# Patient Record
Sex: Male | Born: 1995
Health system: Southern US, Community
[De-identification: ages and names within clinical notes are randomized; demographics above are authoritative.]

## PROBLEM LIST (undated history)

## (undated) HISTORY — PX: NASAL SEPTUM SURGERY: SHX37

---
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
## 2019-06-28 ENCOUNTER — Other Ambulatory Visit: Payer: Self-pay | Admitting: Pediatric Gastroenterology

## 2019-06-28 DIAGNOSIS — K769 Liver disease, unspecified: Secondary | ICD-10-CM

## 2019-06-28 DIAGNOSIS — Z148 Genetic carrier of other disease: Secondary | ICD-10-CM

## 2019-07-03 ENCOUNTER — Ambulatory Visit (HOSPITAL_COMMUNITY)
Admission: RE | Admit: 2019-07-03 | Discharge: 2019-07-03 | Disposition: A | Discharge status: Home / Self Care | Payer: 59 | Source: Ambulatory Visit | Attending: Pediatric Gastroenterology | Admitting: Pediatric Gastroenterology

## 2019-07-03 ENCOUNTER — Other Ambulatory Visit: Payer: Self-pay

## 2019-07-03 DIAGNOSIS — R799 Abnormal finding of blood chemistry, unspecified: Secondary | ICD-10-CM | POA: Insufficient documentation

## 2019-07-03 DIAGNOSIS — Z148 Genetic carrier of other disease: Secondary | ICD-10-CM

## 2019-07-03 DIAGNOSIS — K769 Liver disease, unspecified: Secondary | ICD-10-CM | POA: Insufficient documentation

## 2021-04-17 IMAGING — US US ABDOMEN LIMITED
1 series · 14 of 25 positions shown · non-contrast
Comparison: None.

CLINICAL DATA: Chronic liver disease

EXAM:
ULTRASOUND ABDOMEN LIMITED RIGHT UPPER QUADRANT

[Series 1: us abdomen limited · 14 of 50 slices shown]
[im 1/50]
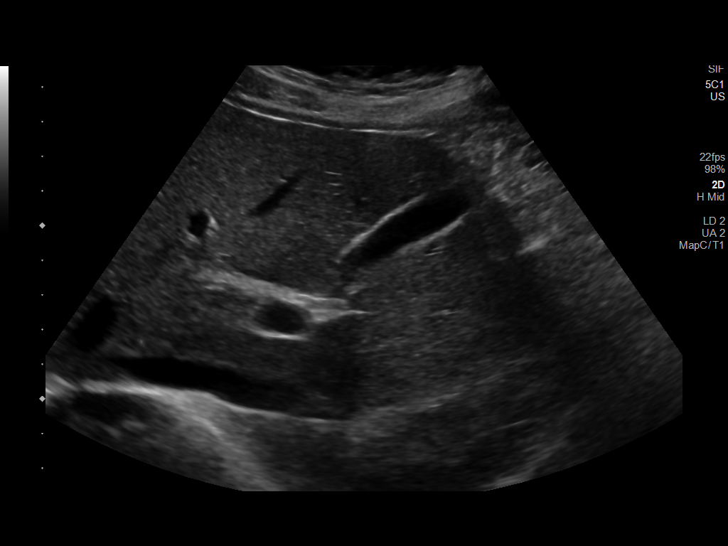
[im 5/50]
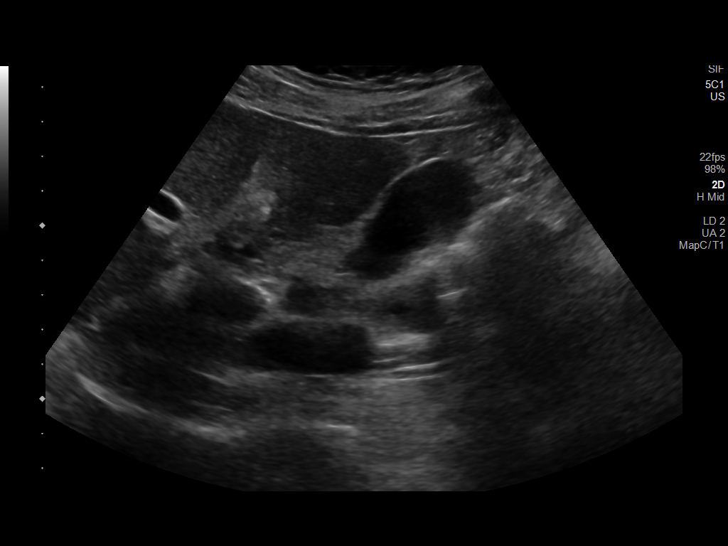
[im 9/50]
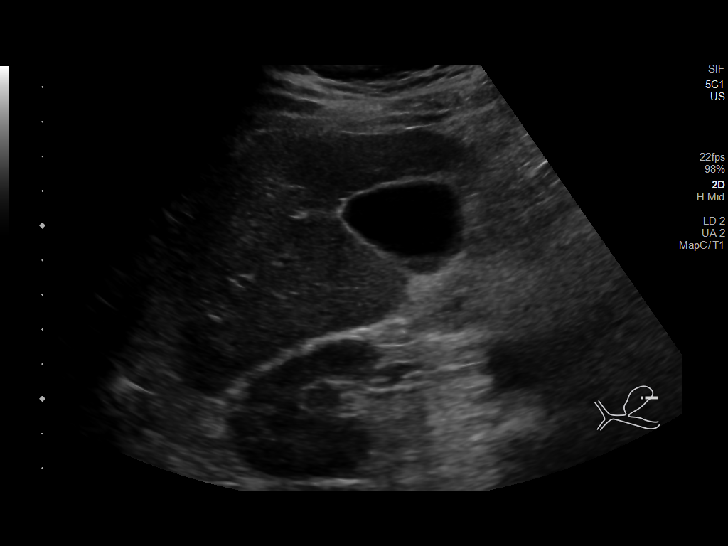
[im 13/50]
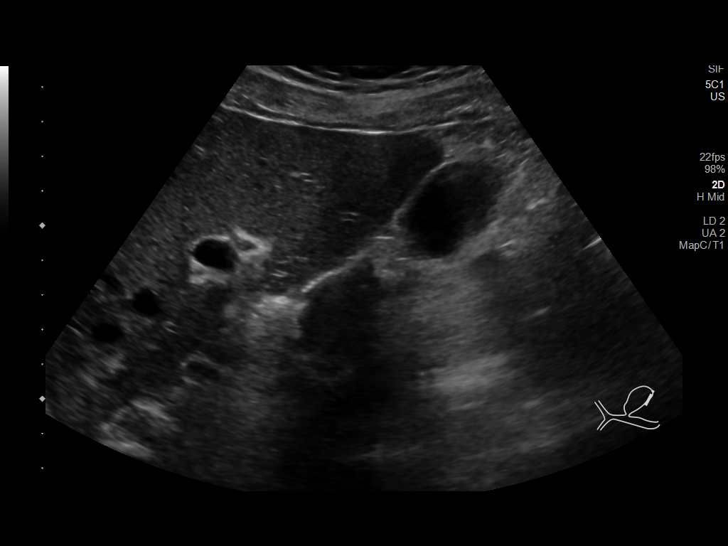
[im 17/50]
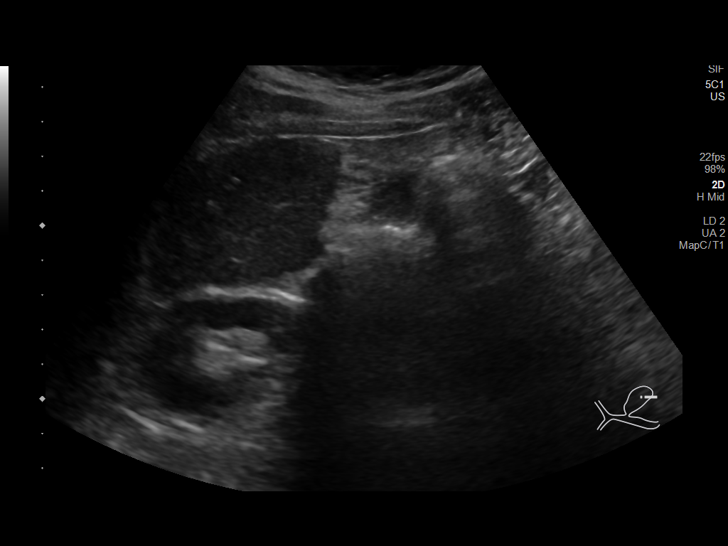
[im 19/50]
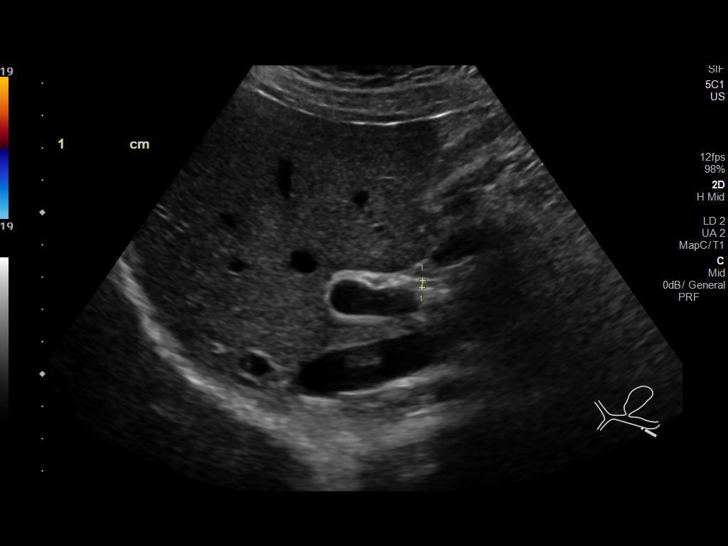
[im 23/50]
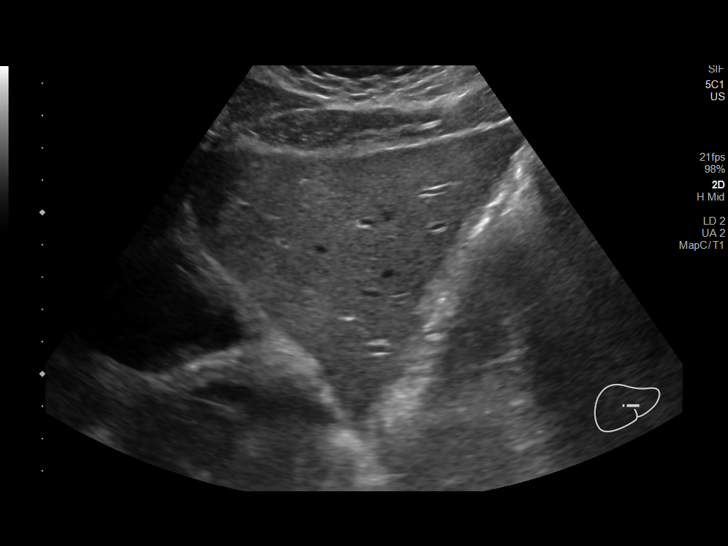
[im 27/50]
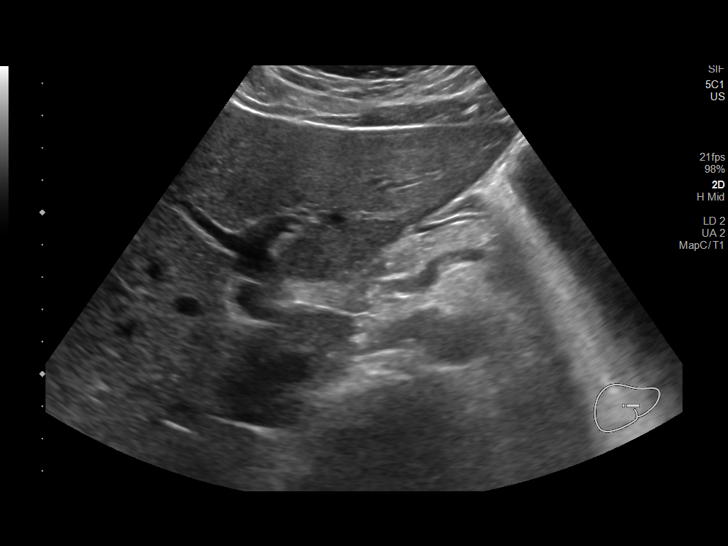
[im 31/50]
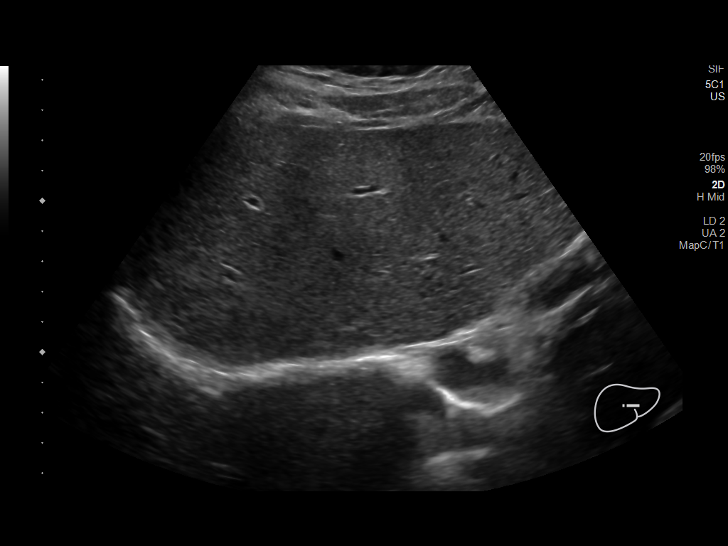
[im 33/50]
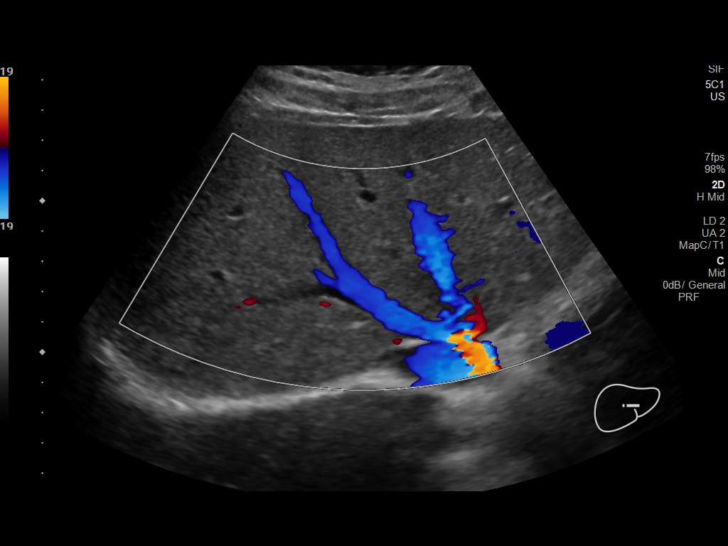
[im 37/50]
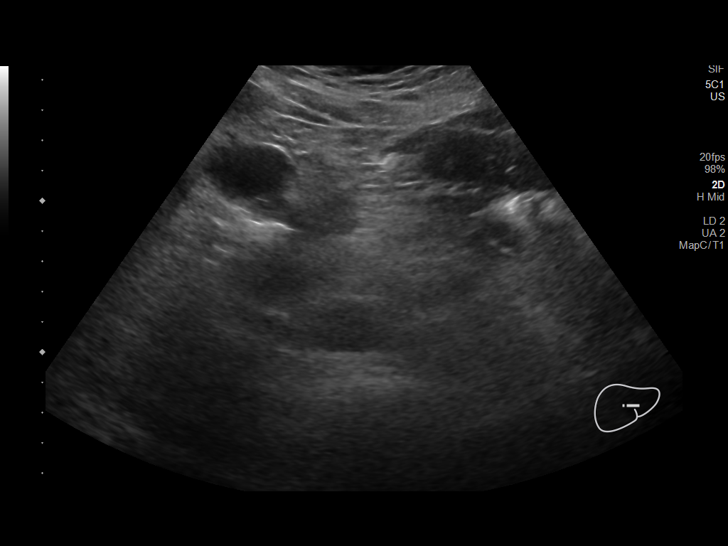
[im 41/50]
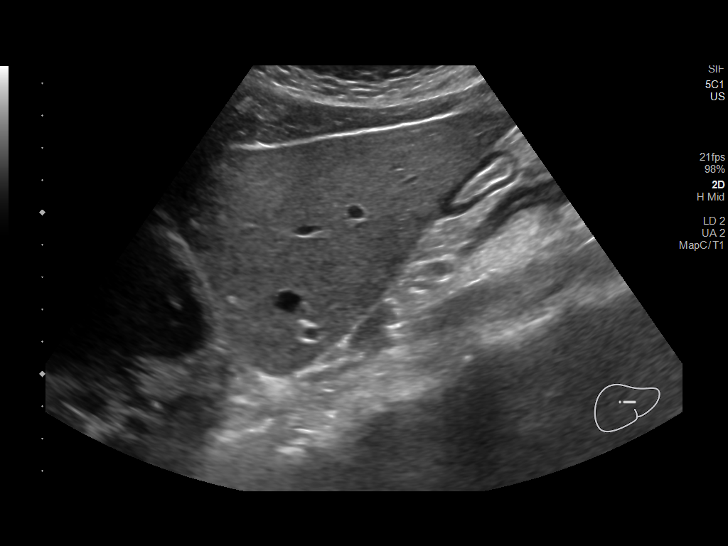
[im 45/50]
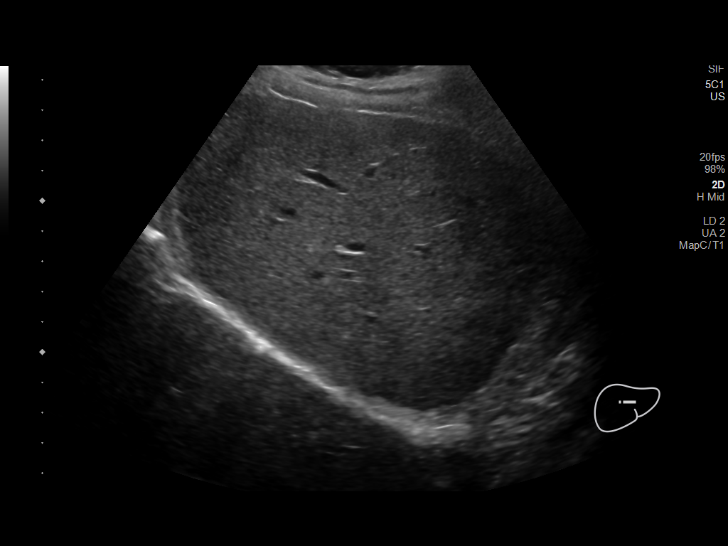
[im 50/50]
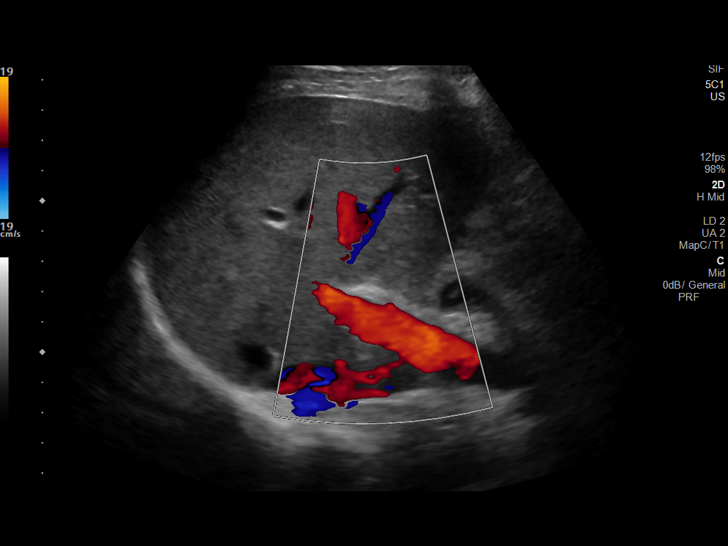

[14 of 25 positions shown; findings below may reference images not displayed]

FINDINGS: Gallbladder:

No gallstones or wall thickening visualized. No sonographic Murphy
sign noted by sonographer.

Common bile duct:

Diameter: 2 mm

Liver:

No focal lesion identified. Within normal limits in parenchymal
echogenicity. Portal vein is patent on color Doppler imaging with
normal direction of blood flow towards the liver.

Other: None.
IMPRESSION: No sonographic abnormality identified in the liver.

## 2021-07-18 ENCOUNTER — Ambulatory Visit: Payer: 59 | Admitting: Registered Nurse

## 2022-02-09 ENCOUNTER — Emergency Department (INDEPENDENT_AMBULATORY_CARE_PROVIDER_SITE_OTHER)
Admission: EM | Admit: 2022-02-09 | Discharge: 2022-02-09 | Disposition: A | Discharge status: Home / Self Care | Payer: 59 | Source: Home / Self Care | Attending: Family Medicine | Admitting: Family Medicine

## 2022-02-09 ENCOUNTER — Ambulatory Visit: Payer: Self-pay

## 2022-02-09 ENCOUNTER — Encounter: Payer: Self-pay | Admitting: Emergency Medicine

## 2022-02-09 DIAGNOSIS — L247 Irritant contact dermatitis due to plants, except food: Secondary | ICD-10-CM

## 2022-02-09 MED ORDER — METHYLPREDNISOLONE ACETATE 80 MG/ML IJ SUSP
80.0000 mg | Freq: Once | INTRAMUSCULAR | Status: AC
  Administered 2022-02-09: 80 mg via INTRAMUSCULAR

## 2022-02-09 NOTE — Discharge Instructions (Signed)
Continue anti histamines for the itching ?May use claritin or zyrtec during the day ?Benadryl at nite ?The shot will last a few days ?Use cool compresses to the eye area ?Call for problems ? ?

## 2022-02-09 NOTE — ED Provider Notes (Signed)
?[facility] ? ? ? ?CSN: [id number] ?Arrival date & time: 02/09/22  1452 ? ? ?  ? ?History   ?Chief Complaint ?Chief Complaint  ?Patient presents with  ? Rash  ? ? ?HPI ?David Richardson is a 26 y.o. male.  ? ?HPI ? ?Patient states he did some yard work and mowing a few days ago.  He then noticed rash on his arms and legs.  He has been using antihistamines and cortisone cream.  He was doing okay, then today noticed that he had rash on both upper eyelids and on the side of his cheeks.  This is causing some swelling and increased discomfort. ? ?History reviewed. No pertinent past medical history. ? ?There are no problems to display for this patient. ? ? ?Past Surgical History:  ?Procedure Laterality Date  ? NASAL SEPTUM SURGERY    ? ? ? ? ? ?Home Medications   ? ?Prior to Admission medications   ?Medication Sig Start Date End Date Taking? Authorizing Provider  ?EPINEPHrine (EPIPEN JR) 0.15 MG/0.3ML injection Inject into the muscle.    [provider]  ? ? ?Family History ?History reviewed. No pertinent family history. ? ?Social History ?Social History  ? ?Tobacco Use  ? Smoking status: Never  ? Smokeless tobacco: Never  ?Vaping Use  ? Vaping Use: Never used  ?Substance Use Topics  ? Alcohol use: Not Currently  ? Drug use: Not Currently  ? ? ? ?Allergies   ?Eggs or egg-derived products and Amoxicillin ? ? ?Review of Systems ?Review of Systems ? ?See HPI ?Physical Exam ?Triage Vital Signs ?ED Triage Vitals  ?Enc Vitals Group  ?   BP 02/09/22 1502 114/70  ?   Pulse Rate 02/09/22 1502 67  ?   Resp 02/09/22 1502 16  ?   Temp 02/09/22 1502 98.1 ?F (36.7 ?C)  ?   Temp Source 02/09/22 1502 Oral  ?   SpO2 02/09/22 1502 99 %  ?   Weight --   ?   Height --   ?   Head Circumference --   ?   Peak Flow --   ?   Pain Score 02/09/22 1503 0  ?   Pain Loc --   ?   Pain Edu? --   ?   Excl. in GC? --   ? ?No data found. ? ?Updated Vital Signs ?BP 114/70 (BP Location: Right Arm)   Pulse 67   Temp 98.1 ?F (36.7 ?C)  (Oral)   Resp 16   SpO2 99%  ? ?   ? ?Physical Exam ?Constitutional:   ?   General: He is not in acute distress. ?   Appearance: He is well-developed.  ?HENT:  ?   Head: Normocephalic and atraumatic.  ? ?   Comments: Patient is fine erythematous vesicular rash on area as indicated ?Eyes:  ?   Conjunctiva/sclera: Conjunctivae normal.  ?   Pupils: Pupils are equal, round, and reactive to light.  ?Cardiovascular:  ?   Rate and Rhythm: Normal rate.  ?Pulmonary:  ?   Effort: Pulmonary effort is normal. No respiratory distress.  ?Abdominal:  ?   General: There is no distension.  ?   Palpations: Abdomen is soft.  ?Musculoskeletal:     ?   General: Normal range of motion.  ?   Cervical back: Normal range of motion.  ?Skin: ?   General: Skin is warm and dry.  ?   Findings: Rash present.  ?  Comments: Scattered vesicular rash on arms and legs with some linear patterns  ?Neurological:  ?   Mental Status: He is alert.  ?Psychiatric:     ?   Mood and Affect: Mood normal.     ?   Behavior: Behavior normal.  ? ? ? ?UC Treatments / Results  ?Labs ?(all labs ordered are listed, but only abnormal results are displayed) ?Labs Reviewed - No data to display ? ?EKG ? ? ?Radiology ?No results found. ? ?Procedures ?Procedures (including critical care time) ? ?Medications Ordered in UC ?Medications  ?methylPREDNISolone acetate (DEPO-MEDROL) injection 80 mg (80 mg Intramuscular Given 02/09/22 1537)  ? ? ?Initial Impression / Assessment and Plan / UC Course  ?I have reviewed the triage vital signs and the nursing notes. ? ?Pertinent labs & imaging results that were available during my care of the patient were reviewed by me and considered in my medical decision making (see chart for details). ? ?  ? ?Final Clinical Impressions(s) / UC Diagnoses  ? ?Final diagnoses:  ?Contact dermatitis and eczema due to plant  ? ? ? ?Discharge Instructions   ? ?  ?Continue anti histamines for the itching ?May use claritin or zyrtec during the day ?Benadryl  at nite ?The shot will last a few days ?Use cool compresses to the eye area ?Call for problems ? ? ? ?ED Prescriptions   ?None ?  ? ?PDMP not reviewed this encounter. ?  ?Eustace Moore, MD ?02/09/22 1652 ? ?

## 2022-02-09 NOTE — ED Triage Notes (Signed)
Pt states last Wednesday he noticed a rash on arms and legs after mowing. States rash has now spread to face and it itching worse.  ?

## 2024-01-26 ENCOUNTER — Ambulatory Visit (INDEPENDENT_AMBULATORY_CARE_PROVIDER_SITE_OTHER)

## 2024-01-26 ENCOUNTER — Ambulatory Visit: Payer: BC Managed Care – PPO | Admitting: Pulmonary Disease

## 2024-01-26 ENCOUNTER — Encounter: Payer: Self-pay | Admitting: Pulmonary Disease

## 2024-01-26 VITALS — BP 123/83 | HR 68 | Ht 66.0 in | Wt 178.0 lb

## 2024-01-26 DIAGNOSIS — E8801 Alpha-1-antitrypsin deficiency: Secondary | ICD-10-CM

## 2024-01-26 NOTE — Patient Instructions (Signed)
 We will check alpha 1 level today and a chest x-ray  Follow up in 3 months for pulmonary function tests

## 2024-01-26 NOTE — Progress Notes (Signed)
 Synopsis: Referred in March 2025 for Alpha-1 antitrypsin deficiency  Subjective:   PATIENT ID: David Richardson GENDER: male DOB: 1996-10-14, MRN: [id number]   HPI  Chief Complaint  Patient presents with   Consult   David Richardson is a 28 year old male, never smoker who is referred to pulmonary clinic for history of alpha-1 antitrypsin deficiency.   He was previously followed at [facility] but would like to establish local care for monitoring of his condition. The patient has serological confirmation of alpha-1 antitrypsin ZZ genotype with level 22 from 2022. He was followed by Hepatology at [facility] but never made it to his pulmonology evaluation. He had IR liver biopsy 11/2022 which did not show significant fibrosis or steatosis.   He denies any respiratory symptoms. He denies cough, wheezing or shortness of breath. 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 He does not smoke or vape. Denies significant dust or chemical exposures from work.   No past medical history on file.   No family history on file.   Social History   Socioeconomic History   Marital status: Single    Spouse name: Not on file   Number of children: Not on file   Years of education: Not on file   Highest education level: Not on file  Occupational History   Not on file  Tobacco Use   Smoking status: Never   Smokeless tobacco: Never  Vaping Use   Vaping status: Never Used  Substance and Sexual Activity   Alcohol use: Not Currently   Drug use: Not Currently   Sexual activity: Not on file  Other Topics Concern   Not on file  Social History Narrative   Not on file   Social Drivers of Health   Financial Resource Strain: Low Risk  (12/13/2023)   Received from [facility]   Overall Financial Resource Strain (CARDIA)    Difficulty of Paying Living Expenses: Not very hard  Food Insecurity: No Food Insecurity (12/13/2023)   Received from [facility]   Hunger Vital Sign    Worried About  Running Out of Food in the Last Year: Never true    Ran Out of Food in the Last Year: Never true  Transportation Needs: No Transportation Needs (12/13/2023)   Received from [facility] - Transportation    Lack of Transportation (Medical): No    Lack of Transportation (Non-Medical): No  Physical Activity: Sufficiently Active (12/13/2023)   Received from [facility]   Exercise Vital Sign    Days of Exercise per Week: 5 days    Minutes of Exercise per Session: 150+ min  Stress: No Stress Concern Present (12/13/2023)   Received from [facility] - Occupational Stress Questionnaire    Feeling of Stress : Only a little  Social Connections: Socially Integrated (12/13/2023)   Received from [facility]   Social Network    How would you rate your social network (family, work, friends)?: Good participation with social networks  Intimate Partner Violence: Not At Risk (12/13/2023)   Received from [facility]   HITS    Over the last 12 months how often did your partner physically hurt you?: Never    Over the last 12 months how often did your partner insult you or talk down to you?: Never    Over the last 12 months how often did your partner threaten you with physical harm?: Never  Over the last 12 months how often did your partner scream or curse at you?: Never     Allergies  Allergen Reactions   Egg-Derived Products Other (See Comments)    Pt denies   Amoxicillin Rash     Outpatient Medications Prior to Visit  Medication Sig Dispense Refill   EPINEPHrine (EPIPEN JR) 0.15 MG/0.3ML injection Inject into the muscle.     No facility-administered medications prior to visit.    Review of Systems  Constitutional:  Negative for chills, fever, malaise/fatigue and weight loss.  HENT:  Negative for congestion, sinus pain and sore throat.   Eyes: Negative.   Respiratory:  Negative for cough, hemoptysis, sputum production, shortness of  breath and wheezing.   Cardiovascular:  Negative for chest pain, palpitations, orthopnea, claudication and leg swelling.  Gastrointestinal:  Negative for abdominal pain, heartburn, nausea and vomiting.  Genitourinary: Negative.   Musculoskeletal:  Negative for joint pain and myalgias.  Skin:  Negative for rash.  Neurological:  Negative for weakness.  Endo/Heme/Allergies: Negative.   Psychiatric/Behavioral: Negative.      Objective:   Vitals:   01/26/24 1540  BP: 123/83  Pulse: 68  SpO2: 98%  Weight: 178 lb (80.7 kg)  Height: 5\' 6"  (1.676 m)   Physical Exam Constitutional:      General: He is not in acute distress.    Appearance: Normal appearance.  Eyes:     General: No scleral icterus.    Conjunctiva/sclera: Conjunctivae normal.  Cardiovascular:     Rate and Rhythm: Normal rate and regular rhythm.  Pulmonary:     Breath sounds: No wheezing, rhonchi or rales.  Musculoskeletal:     Right lower leg: No edema.     Left lower leg: No edema.  Skin:    General: Skin is warm and dry.  Neurological:     General: No focal deficit present.     CBC No results found for: "WBC", "RBC", "HGB", "HCT", "PLT", "MCV", "MCH", "MCHC", "RDW", "LYMPHSABS", "MONOABS", "EOSABS", "BASOSABS"   Chest imaging:  PFT:     No data to display          Labs:  Path: 11/2022 Liver biopsy A: Liver, non-targeted biopsy - Liver parenchyma with mild bile ductular reaction (see comment) - Periportal hepatocytes with PAS-D positive cytoplasmic globules, compatible with clinical history of alpha-1 antitrypsin deficiency   Diagnosis Comment: The PAS-D positive cytoplasmic globules are compatible with the patient's known history of alpha-1-antitrypsin deficiency. No significant fibrosis or steatosis are identified. The patient's chronically elevated liver enzymes with a cholestatic pattern, mild bile ductular reaction, and copper deposition are suspicious for chronic biliary tract injury, however  the pattern of copper deposition is unusual and the histopathologic findings do not point to a specific etiology. Clinical, serologic, and radiographic correlation is recommended.   Echo:  Heart Catheterization:     Assessment & Plan:   Alpha-1-antitrypsin deficiency (HCC) - Plan: DG Chest 2 View, Pulmonary Function Test, Alpha-1-Antitrypsin Deficiency  Discussion: David Richardson is a 28 year old male, never smoker who is referred to pulmonary clinic for history of alpha-1 antitrypsin deficiency.  Alpha-1 Antitrypsin Deficiency Serological confirmation of alpha-1 antitrypsin ZZ genotype with level 22 in 2022.  - Order chest x-ray. - Order pulmonary function tests. - Check alpha-1 antitrypsin level. - Schedule follow-up in 3 months for pulmonary function test review. - recommend annual CMP for monitoring of LFTs   Intravenous augmentation therapy in those with AATD is recommended for: Individuals with an FEV1  less than or equal to 65% predicted. For those with lung disease related to AATD and an FEV1 greater than 65%, we recommend discussion with each individual regarding the potential benefits of reducing lung function decline with consideration of the cost of therapy and lack of evidence for such benefit.   Sandhaus RA, Turino G, Brantly ML, et al. The diagnosis and management of alpha-1 antitrypsin deficiency in the adult. Chronic Obstr Pulm Dis. 2016; 3(3): 161-096. doi: [email or web address]://[email or web address]  Melody Comas, MD [facility] Office: [phone]   See Amion for personal pager PCCM on call pager [phone] until 7pm. Please call Elink 7p-7a. [phone]   Current Outpatient Medications:    EPINEPHrine (EPIPEN JR) 0.15 MG/0.3ML injection, Inject into the muscle., Disp: , Rfl:

## 2024-01-30 ENCOUNTER — Encounter: Payer: Self-pay | Admitting: Pulmonary Disease

## 2024-02-08 ENCOUNTER — Ambulatory Visit: Payer: Self-pay | Admitting: Allergy

## 2024-06-15 ENCOUNTER — Ambulatory Visit: Payer: Self-pay | Admitting: Allergy

## 2024-08-14 NOTE — Progress Notes (Unsigned)
 New Patient Note  RE: ODIES DESA MRN: [id number] DOB: February 20, 1996 Date of Office Visit: 08/15/2024  Consult requested by: No ref. provider found Primary care provider: Pcp, No  Chief Complaint: No chief complaint on file.  History of Present Illness: I had the pleasure of seeing Roel Douthat for initial evaluation at the [facility] on 08/15/2024. He is a 28 y.o. male, who is referred here by No ref. provider found for the evaluation of ***.  Discussed the use of AI scribe software for clinical note transcription with the patient, who gave verbal consent to proceed.  History of Present Illness             ***  Assessment and Plan: Nghia is a 28 y.o. male with: ***  Assessment and Plan               No follow-ups on file.  No orders of the defined types were placed in this encounter.  Lab Orders  No laboratory test(s) ordered today    Other allergy screening: Asthma: {Blank single:19197::yes,no} Rhino conjunctivitis: {Blank single:19197::yes,no} Food allergy: {Blank single:19197::yes,no} Medication allergy: {Blank single:19197::yes,no} Hymenoptera allergy: {Blank single:19197::yes,no} Urticaria: {Blank single:19197::yes,no} Eczema:{Blank single:19197::yes,no} History of recurrent infections suggestive of immunodeficency: {Blank single:19197::yes,no}  Diagnostics: Spirometry:  Tracings reviewed. His effort: {Blank single:19197::Good reproducible efforts.,It was hard to get consistent efforts and there is a question as to whether this reflects a maximal maneuver.,Poor effort, data can not be interpreted.} FVC: ***L FEV1: ***L, ***% predicted FEV1/FVC ratio: ***% Interpretation: {Blank single:19197::Spirometry consistent with mild obstructive disease,Spirometry consistent with moderate obstructive disease,Spirometry consistent with severe obstructive disease,Spirometry consistent with  possible restrictive disease,Spirometry consistent with mixed obstructive and restrictive disease,Spirometry uninterpretable due to technique,Spirometry consistent with normal pattern,No overt abnormalities noted given today's efforts}.  Please see scanned spirometry results for details.  Skin Testing: {Blank single:19197::Select foods,Environmental allergy panel,Environmental allergy panel and select foods,Food allergy panel,None,Deferred due to recent antihistamines use}. *** Results discussed with patient/family.   Past Medical History: There are no active problems to display for this patient.  No past medical history on file. Past Surgical History: Past Surgical History:  Procedure Laterality Date  . NASAL SEPTUM SURGERY     Medication List:  Current Outpatient Medications  Medication Sig Dispense Refill  . EPINEPHrine (EPIPEN JR) 0.15 MG/0.3ML injection Inject into the muscle.     No current facility-administered medications for this visit.   Allergies: Allergies  Allergen Reactions  . Egg Protein-Containing Drug Products Other (See Comments)    Pt denies  . Amoxicillin Rash   Social History: Social History   Socioeconomic History  . Marital status: Single    Spouse name: Not on file  . Number of children: Not on file  . Years of education: Not on file  . Highest education level: Not on file  Occupational History  . Not on file  Tobacco Use  . Smoking status: Never  . Smokeless tobacco: Never  Vaping Use  . Vaping status: Never Used  Substance and Sexual Activity  . Alcohol use: Not Currently  . Drug use: Not Currently  . Sexual activity: Not on file  Other Topics Concern  . Not on file  Social History Narrative  . Not on file   Social Drivers of Health   Financial Resource Strain: Low Risk  (12/13/2023)   Received from [facility]   Overall Financial Resource Strain (CARDIA)   . Difficulty of Paying Living Expenses: Not very  hard  Food Insecurity: No Food Insecurity (12/13/2023)   Received from [facility]   Hunger Vital Sign   . Within the past 12 months, you worried that your food would run out before you got the money to buy more.: Never true   . Within the past 12 months, the food you bought just didn't last and you didn't have money to get more.: Never true  Transportation Needs: No Transportation Needs (12/13/2023)   Received from [facility] - Transportation   . Lack of Transportation (Medical): No   . Lack of Transportation (Non-Medical): No  Physical Activity: Sufficiently Active (12/13/2023)   Received from [facility]   Exercise Vital Sign   . On average, how many days per week do you engage in moderate to strenuous exercise (like a brisk walk)?: 5 days   . On average, how many minutes do you engage in exercise at this level?: 150+ min  Stress: No Stress Concern Present (12/13/2023)   Received from [facility] - Occupational Stress Questionnaire   . Feeling of Stress : Only a little  Social Connections: Socially Integrated (12/13/2023)   Received from [facility]   Social Network   . How would you rate your social network (family, work, friends)?: Good participation with social networks   Lives in a ***. Smoking: *** Occupation: ***  Environmental HistorySurveyor, minerals in the house: Network engineer in the family room: {Blank single:19197::yes,no} Carpet in the bedroom: {Blank single:19197::yes,no} Heating: {Blank single:19197::electric,gas,heat pump} Cooling: {Blank single:19197::central,window,heat pump} Pet: {Blank single:19197::yes ***,no}  Family History: No family history on file. Problem                               Relation Asthma                                   *** Eczema                                *** Food allergy                          *** Allergic rhino  conjunctivitis     ***  Review of Systems  Constitutional:  Negative for appetite change, chills, fever and unexpected weight change.  HENT:  Negative for congestion and rhinorrhea.   Eyes:  Negative for itching.  Respiratory:  Negative for cough, chest tightness, shortness of breath and wheezing.   Cardiovascular:  Negative for chest pain.  Gastrointestinal:  Negative for abdominal pain.  Genitourinary:  Negative for difficulty urinating.  Skin:  Negative for rash.  Neurological:  Negative for headaches.    Objective: There were no vitals taken for this visit. There is no height or weight on file to calculate BMI. Physical Exam Vitals and nursing note reviewed.  Constitutional:      Appearance: Normal appearance. He is well-developed.  HENT:     Head: Normocephalic and atraumatic.     Right Ear: Tympanic membrane and external ear normal.     Left Ear: Tympanic membrane and external ear normal.     Nose: Nose normal.     Mouth/Throat:     Mouth: Mucous membranes are moist.  Pharynx: Oropharynx is clear.  Eyes:     Conjunctiva/sclera: Conjunctivae normal.  Cardiovascular:     Rate and Rhythm: Normal rate and regular rhythm.     Heart sounds: Normal heart sounds. No murmur heard.    No friction rub. No gallop.  Pulmonary:     Effort: Pulmonary effort is normal.     Breath sounds: Normal breath sounds. No wheezing, rhonchi or rales.  Musculoskeletal:     Cervical back: Neck supple.  Skin:    General: Skin is warm.     Findings: No rash.  Neurological:     Mental Status: He is alert and oriented to person, place, and time.  Psychiatric:        Behavior: Behavior normal.   The plan was reviewed with the patient/family, and all questions/concerned were addressed.  It was my pleasure to see David Richardson today and participate in his care. Please feel free to contact me with any questions or concerns.  Sincerely,  Orlan Cramp, DO Allergy & Immunology  [facility]  [place] office: [phone] [facility] office: [phone]

## 2024-08-15 ENCOUNTER — Other Ambulatory Visit: Payer: Self-pay

## 2024-08-15 ENCOUNTER — Ambulatory Visit: Payer: Self-pay | Admitting: Allergy

## 2024-08-15 ENCOUNTER — Encounter: Payer: Self-pay | Admitting: Allergy

## 2024-08-15 VITALS — BP 122/86 | HR 63 | Temp 98.7°F | Resp 18 | Ht 66.0 in | Wt 181.4 lb

## 2024-08-15 DIAGNOSIS — T7800XD Anaphylactic reaction due to unspecified food, subsequent encounter: Secondary | ICD-10-CM | POA: Diagnosis not present

## 2024-08-15 DIAGNOSIS — T7800XA Anaphylactic reaction due to unspecified food, initial encounter: Secondary | ICD-10-CM

## 2024-08-15 DIAGNOSIS — J3089 Other allergic rhinitis: Secondary | ICD-10-CM | POA: Diagnosis not present

## 2024-08-15 DIAGNOSIS — E8801 Alpha-1-antitrypsin deficiency: Secondary | ICD-10-CM | POA: Diagnosis not present

## 2024-08-15 DIAGNOSIS — Z880 Allergy status to penicillin: Secondary | ICD-10-CM

## 2024-08-15 NOTE — Patient Instructions (Addendum)
 Food allergies Let me know what epipen dose you have at home. You should have the 0.3mg  dose one. Continue strict avoidance of peanuts, tree nuts.  For mild symptoms you can take over the counter antihistamines (zyrtec 10mg  to 20mg ) and monitor symptoms closely.  If symptoms worsen or if you have severe symptoms including breathing issues, throat closure, significant swelling, whole body hives, severe diarrhea and vomiting, lightheadedness then use epinephrine and seek immediate medical care afterwards. Emergency action plan given. Get bloodwork If negative or borderline positive will recommend skin testing for select foods as well.  We are ordering labs, so please allow 1-2 weeks for the results to come back. With the newly implemented Cures Act, the labs might be visible to you at the same time that they become visible to me. However, I will not address the results until all of the results are back, so please be patient.  In the meantime, continue recommendations in your patient instructions, including avoidance measures (if applicable), until you hear from me.  Environmental allergies Return for allergy skin testing. Will make additional recommendations based on results. Make sure you don't take any antihistamines for 3 days before the skin testing appointment. Don't put any lotion on the back and arms on the day of testing.  Must be in good health and not ill. No vaccines/injections/antibiotics within the past 7 days.  Plan on being here for 30-60 minutes.  Penicillin allergy: Consider penicillin allergy skin testing and in office drug challenge in the future.  Over 90% of people with history of penicillin allergy which occurred over 10 years ago are found to be non-allergic.  You must be off antihistamines for 3-5 days before. Must be in good health and not ill. No vaccines/injections/antibiotics within the past 7 days. Plan on being in the office for 2-3 hours and must bring in the drug  you want to do the oral challenge for - will send in prescription to pick up a few days before. You must call to schedule an appointment and specify it's for a drug challenge.   Follow up for skin testing.

## 2024-08-20 ENCOUNTER — Ambulatory Visit: Payer: Self-pay | Admitting: Allergy

## 2024-08-20 LAB — PEANUT COMPONENTS
F352-IgE Ara h 8: <0.1 kU/L
F422-IgE Ara h 1: <0.1 kU/L
F423-IgE Ara h 2: 0.13 kU/L — AB
F424-IgE Ara h 3: <0.1 kU/L
F427-IgE Ara h 9: <0.1 kU/L
F447-IgE Ara h 6: 0.13 kU/L — AB

## 2024-08-20 LAB — IGE NUT PROF. W/COMPONENT RFLX
F017-IgE Hazelnut (Filbert): <0.1 kU/L
F018-IgE Brazil Nut: <0.1 kU/L
F020-IgE Almond: <0.1 kU/L
F202-IgE Cashew Nut: <0.1 kU/L
F203-IgE Pistachio Nut: <0.1 kU/L
F256-IgE Walnut: <0.1 kU/L
Macadamia Nut, IgE: <0.1 kU/L
Peanut, IgE: 0.26 kU/L — AB
Pecan Nut IgE: <0.1 kU/L

## 2024-08-20 LAB — IGE: IgE (Immunoglobulin E), Serum: 80 [IU]/mL (ref 6–495)

## 2024-08-20 LAB — ALLERGEN COMPONENT COMMENTS

## 2024-09-05 ENCOUNTER — Ambulatory Visit: Admitting: Allergy

## 2024-09-05 ENCOUNTER — Encounter: Payer: Self-pay | Admitting: Allergy

## 2024-09-05 DIAGNOSIS — J3089 Other allergic rhinitis: Secondary | ICD-10-CM | POA: Diagnosis not present

## 2024-09-05 DIAGNOSIS — T7800XD Anaphylactic reaction due to unspecified food, subsequent encounter: Secondary | ICD-10-CM | POA: Diagnosis not present

## 2024-09-05 DIAGNOSIS — Z880 Allergy status to penicillin: Secondary | ICD-10-CM

## 2024-09-05 DIAGNOSIS — E8801 Alpha-1-antitrypsin deficiency: Secondary | ICD-10-CM

## 2024-09-05 NOTE — Progress Notes (Signed)
 Skin testing note  RE: David Richardson MRN: [id number] DOB: 1996/02/15 Date of Office Visit: 09/05/2024  Referring provider: No ref. provider found Primary care provider: Arby Lyle LABOR, NP  Chief Complaint: skin testing  History of Present Illness: I had the pleasure of seeing David Richardson for a skin testing visit at the [facility] on 09/05/2024. He is a 28 y.o. male, who is being followed for food allergies, allergic rhinitis, penicillin allergy, alpha 1 antitrypsin deficiency. His previous allergy office visit was on 08/15/2024 with Dr. Luke. Today is a skin testing visit.   Discussed the use of AI scribe software for clinical note transcription with the patient, who gave verbal consent to proceed.    He has multiple environmental allergies - trees, grass, ragweed, weed, mold, dust mites, and cats.  He has a known peanut allergy, which has previously resulted in a severe reaction characterized by throat swelling, necessitating the use of an EpiPen. He confirms possession of a 0.3 mg EpiPen at home. He is not interested in consuming tree nuts and is aware that peanuts and tree nuts belong to different families.  He has a history of alpha-1 antitrypsin deficiency.     2025 labs: Tree nuts were negative peanut was only borderline positive. Recommend in office skin testing next which you are already scheduled for. If negative will discuss in office food challenge next. Continue to avoid for now.   Assessment and Plan: David Richardson is a 29 y.o. male with: Other allergic rhinitis Past history - Persistent symptoms in the spring and fall.  09/05/2024 skin testing positive to grass, ragweed, weed, trees, mold, dust mites, cat. Start environmental control measures as below. Use over the counter antihistamines such as Zyrtec (cetirizine), Claritin (loratadine), Allegra (fexofenadine), or Xyzal (levocetirizine) daily as needed. May take twice a day during allergy  flares. May switch antihistamines every few months. Use Flonase (fluticasone) nasal spray 1-2 sprays per nostril once a day as needed for nasal congestion.  Nasal saline spray (i.e., Simply Saline) or nasal saline lavage (i.e., NeilMed) is recommended as needed and prior to medicated nasal sprays. Consider allergy injections for long term control if above medications do not help the symptoms - handout given.  2 injections.   Anaphylactic reaction due to food, subsequent encounter Past history - Anaphylaxis to peanuts with throat swelling as a toddler requiring IM epi. No recent exposures. No prior evaluation. 2025 labs negative to tree nuts, borderline to peanuts.  09/05/2024 skin testing positive to peanuts. Borderline to pistachio and coconut.  Continue strict avoidance of peanuts, tree nuts.  If interested we can schedule food challenge to mixed tree nut butter.  For mild symptoms you can take over the counter antihistamines (zyrtec 10mg  to 20mg ) and monitor symptoms closely.  If symptoms worsen or if you have severe symptoms including breathing issues, throat closure, significant swelling, whole body hives, severe diarrhea and vomiting, lightheadedness then use epinephrine and seek immediate medical care afterwards. Emergency action plan in place.  Penicillin allergy Past history - Rash as a child. Consider penicillin allergy skin testing and in office drug challenge in the future.  Over 90% of people with history of penicillin allergy which occurred over 10 years ago are found to be non-allergic.   Alpha-1-antitrypsin deficiency (HCC) Past history - Diagnosed in childhood. Stable levels with no liver or respiratory complications. Managed by pulmonology.   Anaphylaxis to peanuts with throat swelling as a toddler requiring IM epi. No recent  exposures. No prior evaluation. Let me know what Epipen dose you have at home. Continue strict avoidance of peanuts, tree nuts.  For mild symptoms  you can take over the counter antihistamines (zyrtec 10mg  to 20mg ) and monitor symptoms closely.  If symptoms worsen or if you have severe symptoms including breathing issues, throat closure, significant swelling, whole body hives, severe diarrhea and vomiting, lightheadedness then use epinephrine and seek immediate medical care afterwards. Emergency action plan given. Get bloodwork If negative or borderline positive will recommend skin testing for select foods as well.    Return in about 6 months (around 03/05/2025).  No orders of the defined types were placed in this encounter.  Lab Orders  No laboratory test(s) ordered today    Diagnostics: Skin Testing: Environmental allergy panel and select foods. 09/05/2024 skin testing positive to grass, ragweed, weed, trees, mold, dust mites, cat. Positive to peanuts. Borderline to pistachio and coconut.  Results discussed with patient/family.  Airborne Adult Perc - 09/05/24 0906     Time Antigen Placed 9093    Allergen Manufacturer Jestine    Location Back    Number of Test 55    1. Control-Buffer 50% Glycerol Negative    3. Bahia 2+    4. Bermuda 2+    5. Johnson 2+    6. [place]  Blue Negative    7. Meadow Fescue 3+    8. Perennial Rye 3+    9. Timothy Negative    10. Ragweed Mix 2+    11. Cocklebur --   +/-   12. Plantain,  English --   +/-   13. Baccharis Negative    14. Dog Fennel Negative    15. Russian Thistle --   +/-   16. Lamb's Quarters Negative    17. Sheep Sorrell 2+    18. Rough Pigweed 2+    19. Marsh Elder, Rough Negative    20. Mugwort, Common 2+    21. Box, Elder 2+    22. Cedar, red 2+    23. Sweet Gum 2+    24. Pecan Pollen 2+    25. Pine Mix Negative    26. Walnut, Black Pollen 3+    27. Red Mulberry 3+    28. Ash Mix 3+    29. Birch Mix 3+    30. Beech American 2+    31. Cottonwood, Eastern 3+    32. Hickory, White 2+    33. Maple Mix --   +/-   34. Oak, Eastern Mix 2+    35. Sycamore Eastern 2+     36. Alternaria Alternata Negative    37. Cladosporium Herbarum Negative    38. Aspergillus Mix Negative    39. Penicillium Mix Negative    40. Bipolaris Sorokiniana (Helminthosporium) 2+    41. Drechslera Spicifera (Curvularia) 2+    42. Mucor Plumbeus Negative    43. Fusarium Moniliforme 3+    44. Aureobasidium Pullulans (pullulara) Negative    45. Rhizopus Oryzae Negative    46. Botrytis Cinera Negative    47. Epicoccum Nigrum Negative    48. Phoma Betae 2+    49. Dust Mite Mix 3+    50. Cat Hair 10,000 BAU/ml 2+    51.  Dog Epithelia Negative    52. Mixed Feathers Negative    53. Horse Epithelia Negative    54. Cockroach, German Negative    55. Tobacco Leaf Negative          Intradermal -  09/05/24 0917     Time Antigen Placed 9082    Allergen Manufacturer Jestine    Location Arm    Number of Test 5    Control Negative    Mold 1 3+    Mold 2 3+    Dog Negative    Cockroach Negative          Food Adult Perc - 09/05/24 0900     Time Antigen Placed 9092    Allergen Manufacturer Jestine    Location Back    Number of allergen test 11    1. Peanut --   6x5   10. Cashew Negative    11. Walnut Food Negative    12. Almond Negative    13. Hazelnut Negative    14. Pecan Food Negative    15. Pistachio --   +/-   16. Brazil Nut Negative    17. Coconut --   +/-         Previous notes and tests were reviewed. The plan was reviewed with the patient/family, and all questions/concerned were addressed.  It was my pleasure to see David Richardson today and participate in his care. Please feel free to contact me with any questions or concerns.  Sincerely,  Orlan Cramp, DO Allergy & Immunology  [facility]  [place] office: [phone] [facility] office: [phone]

## 2024-09-05 NOTE — Patient Instructions (Addendum)
 09/05/2024 skin testing positive to grass, ragweed, weed, trees, mold, dust mites, cat. Positive to peanuts. Borderline to pistachio and coconut.   Results given.  Environmental allergies Start environmental control measures as below. Use over the counter antihistamines such as Zyrtec (cetirizine), Claritin (loratadine), Allegra (fexofenadine), or Xyzal (levocetirizine) daily as needed. May take twice a day during allergy flares. May switch antihistamines every few months. Use Flonase (fluticasone) nasal spray 1-2 sprays per nostril once a day as needed for nasal congestion.  Nasal saline spray (i.e., Simply Saline) or nasal saline lavage (i.e., NeilMed) is recommended as needed and prior to medicated nasal sprays. Consider allergy injections for long term control if above medications do not help the symptoms - handout given.  2 injections.   Food allergies Continue strict avoidance of peanuts, tree nuts.  If interested we can schedule food challenge to mixed tree nut butter.  Food challenge instructions: You must be off antihistamines for 3-5 days before. Must be in good health and not ill. No vaccines/injections/antibiotics within the past 7 days.  Plan on being in the office for 2-3 hours and must bring in the food you want to do the oral challenge for - mixed tree nut butter.  You must call to schedule an appointment and specify it's for a food challenge.   For mild symptoms you can take over the counter antihistamines (zyrtec 10mg  to 20mg ) and monitor symptoms closely.  If symptoms worsen or if you have severe symptoms including breathing issues, throat closure, significant swelling, whole body hives, severe diarrhea and vomiting, lightheadedness then use epinephrine and seek immediate medical care afterwards. Emergency action plan in place.  Penicillin allergy: Consider penicillin allergy skin testing and in office drug challenge in the future.  Over 90% of people with history of  penicillin allergy which occurred over 10 years ago are found to be non-allergic.  You must be off antihistamines for 3-5 days before. Must be in good health and not ill. No vaccines/injections/antibiotics within the past 7 days. Plan on being in the office for 2-3 hours and must bring in the drug you want to do the oral challenge for - will send in prescription to pick up a few days before. You must call to schedule an appointment and specify it's for a drug challenge.   Alpha-1 antitrypsin deficiency Managed by pulmonology.   Return in about 6 months (around 03/05/2025). Or sooner if needed.   Reducing Pollen Exposure Pollen seasons: trees (spring), grass (summer) and ragweed/weeds (fall). Keep windows closed in your home and car to lower pollen exposure.  Install air conditioning in the bedroom and throughout the house if possible.  Avoid going out in dry windy days - especially early morning. Pollen counts are highest between 5 - 10 AM and on dry, hot and windy days.  Save outside activities for late afternoon or after a heavy rain, when pollen levels are lower.  Avoid mowing of grass if you have grass pollen allergy. Be aware that pollen can also be transported indoors on people and pets.  Dry your clothes in an automatic dryer rather than hanging them outside where they might collect pollen.  Rinse hair and eyes before bedtime. Mold Control Mold and fungi can grow on a variety of surfaces provided certain temperature and moisture conditions exist.  Outdoor molds grow on plants, decaying vegetation and soil. The major outdoor mold, Alternaria and Cladosporium, are found in very high numbers during hot and dry conditions. Generally, a late summer -  fall peak is seen for common outdoor fungal spores. Rain will temporarily lower outdoor mold spore count, but counts rise rapidly when the rainy period ends. The most important indoor molds are Aspergillus and Penicillium. Dark, humid and poorly  ventilated basements are ideal sites for mold growth. The next most common sites of mold growth are the bathroom and the kitchen. Outdoor (Seasonal) Mold Control Use air conditioning and keep windows closed. Avoid exposure to decaying vegetation. Avoid leaf raking. Avoid grain handling. Consider wearing a face mask if working in moldy areas.  Indoor (Perennial) Mold Control  Maintain humidity below 50%. Get rid of mold growth on hard surfaces with water, detergent and, if necessary, 5% bleach (do not mix with other cleaners). Then dry the area completely. If mold covers an area more than 10 square feet, consider hiring an indoor environmental professional. For clothing, washing with soap and water is best. If moldy items cannot be cleaned and dried, throw them away. Remove sources e.g. contaminated carpets. Repair and seal leaking roofs or pipes. Using dehumidifiers in damp basements may be helpful, but empty the water and clean units regularly to prevent mildew from forming. All rooms, especially basements, bathrooms and kitchens, require ventilation and cleaning to deter mold and mildew growth. Avoid carpeting on concrete or damp floors, and storing items in damp areas. Control of House Dust Mite Allergen Dust mite allergens are a common trigger of allergy and asthma symptoms. While they can be found throughout the house, these microscopic creatures thrive in warm, humid environments such as bedding, upholstered furniture and carpeting. Because so much time is spent in the bedroom, it is essential to reduce mite levels there.  Encase pillows, mattresses, and box springs in special allergen-proof fabric covers or airtight, zippered plastic covers.  Bedding should be washed weekly in hot water (130 F) and dried in a hot dryer. Allergen-proof covers are available for comforters and pillows that can't be regularly washed.  Wash the allergy-proof covers every few months. Minimize clutter in the  bedroom. Keep pets out of the bedroom.  Keep humidity less than 50% by using a dehumidifier or air conditioning. You can buy a humidity measuring device called a hygrometer to monitor this.  If possible, replace carpets with hardwood, linoleum, or washable area rugs. If that's not possible, vacuum frequently with a vacuum that has a HEPA filter. Remove all upholstered furniture and non-washable window drapes from the bedroom. Remove all non-washable stuffed toys from the bedroom.  Wash stuffed toys weekly. Pet Allergen Avoidance: Contrary to popular opinion, there are no "hypoallergenic" breeds of dogs or cats. That is because people are not allergic to an animal's hair, but to an allergen found in the animal's saliva, dander (dead skin flakes) or urine. Pet allergy symptoms typically occur within minutes. For some people, symptoms can build up and become most severe 8 to 12 hours after contact with the animal. People with severe allergies can experience reactions in public places if dander has been transported on the pet owners' clothing. Keeping an animal outdoors is only a partial solution, since homes with pets in the yard still have higher concentrations of animal allergens. Before getting a pet, ask your allergist to determine if you are allergic to animals. If your pet is already considered part of your family, try to minimize contact and keep the pet out of the bedroom and other rooms where you spend a great deal of time. As with dust mites, vacuum carpets often or replace  carpet with a hardwood floor, tile or linoleum. High-efficiency particulate air (HEPA) cleaners can reduce allergen levels over time. While dander and saliva are the source of cat and dog allergens, urine is the source of allergens from rabbits, hamsters, mice and guinea pigs; so ask a non-allergic family member to clean the animal's cage. If you have a pet allergy, talk to your allergist about the potential for allergy  immunotherapy (allergy shots). This strategy can often provide long-term relief.
